# Patient Record
Sex: Female | Born: 1995 | Race: Black or African American | Hispanic: No | Marital: Single | Smoking: Never smoker
Health system: Southern US, Community
[De-identification: ages and names within clinical notes are randomized; demographics above are authoritative.]

---
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
## 2017-06-13 ENCOUNTER — Emergency Department (HOSPITAL_COMMUNITY)
Admission: EM | Admit: 2017-06-13 | Discharge: 2017-06-14 | Disposition: A | Discharge status: Home / Self Care | Payer: BLUE CROSS/BLUE SHIELD | Attending: Emergency Medicine | Admitting: Emergency Medicine

## 2017-06-13 DIAGNOSIS — F10920 Alcohol use, unspecified with intoxication, uncomplicated: Secondary | ICD-10-CM | POA: Insufficient documentation

## 2017-06-13 LAB — CBG MONITORING, ED: GLUCOSE-CAPILLARY: 69 mg/dL (ref 65–99)

## 2017-06-13 MED ORDER — SODIUM CHLORIDE 0.9 % IV BOLUS
1000.0000 mL | Freq: Once | INTRAVENOUS | Status: AC
  Administered 2017-06-13: 1000 mL via INTRAVENOUS

## 2017-06-13 NOTE — ED Notes (Signed)
Bed: [id number] Expected date:  Expected time:  Means of arrival:  Comments: 21 ETOH/vomiting

## 2017-06-13 NOTE — ED Triage Notes (Signed)
Pt BIB [facility]. Pt has been drinking since 1500. The pt is unsure of the amount of ETOH consumed. She reports that it was dark liquor. She is CAOx4, easily able to be aroused. Pt vomited prior to arrival and 4mg  of zofran was given via EMS. Pt able to control airway.

## 2017-06-14 LAB — POC URINE PREG, ED: Preg Test, Ur: NEGATIVE

## 2017-06-14 NOTE — ED Provider Notes (Signed)
[facility]-EMERGENCY DEPT Provider Note   CSN: [id number] Arrival date & time: 06/13/17  2029     History   Chief Complaint Chief Complaint  Patient presents with  . Alcohol Intoxication    HPI Windell Norfolkayla Grimes is a 22 y.o. female.  HPI  22yo female presents to the ED with her friend with concern for intoxication.  Reports they had been drinking dark liquor since 3PM, not sure exactly how much.  Had nausea and vomiting.  Denies other drug use, pain or other concerns on initial eval.  On reeval reports her brother has a history of CHF and she has had episodes of syncope preceded by sensation of heat and lightheadedness in the past.  No episode today specifically but reports she was wondering about this. Had been evaluated at home for it before.   No past medical history on file.  There are no active problems to display for this patient.   No past surgical history on file.   OB History   None      Home Medications    Prior to Admission medications   Not on File    Family History No family history on file.  Social History Social History   Tobacco Use  . Smoking status: Never Smoker  . Smokeless tobacco: Never Used  Substance Use Topics  . Alcohol use: No  . Drug use: No     Allergies   Patient has no known allergies.   Review of Systems Review of Systems  Unable to perform ROS: Mental status change  Constitutional: Negative for fever.  Respiratory: Negative for cough.   Gastrointestinal: Positive for nausea and vomiting.  Neurological: Negative for syncope (has had in past).     Physical Exam Updated Vital Signs BP 104/64   Pulse (!) 106   Temp 97.7 F (36.5 C) (Oral)   Resp 20   SpO2 99%   Physical Exam  Constitutional: She appears well-developed and well-nourished. No distress.  HENT:  Head: Normocephalic and atraumatic.  Eyes: Conjunctivae and EOM are normal.  Neck: Normal range of motion.  Cardiovascular:  Normal rate, regular rhythm, normal heart sounds and intact distal pulses. Exam reveals no gallop and no friction rub.  No murmur heard. Pulmonary/Chest: Effort normal and breath sounds normal. No respiratory distress. She has no wheezes. She has no rales.  Abdominal: Soft. She exhibits no distension. There is no tenderness. There is no guarding.  Musculoskeletal: She exhibits no edema or tenderness.  Neurological: She is alert.  Intoxicated, oriented  Skin: Skin is warm and dry. No rash noted. She is not diaphoretic. No erythema.  Nursing note and vitals reviewed.    ED Treatments / Results  Labs (all labs ordered are listed, but only abnormal results are displayed) Labs Reviewed  CBG MONITORING, ED  POC URINE PREG, ED    EKG EKG Interpretation  Date/Time:  Wednesday June 14 2017 00:22:22 EDT Ventricular Rate:  81 PR Interval:    QRS Duration: 88 QT Interval:  381 QTC Calculation: 443 R Axis:   87 Text Interpretation:  Sinus rhythm No previous ECGs available Confirmed by Alvira MondaySchlossman, Desani Sprung ([id number]) on 06/14/2017 12:32:07 AM Also confirmed by Alvira MondaySchlossman, Khole Arterburn ([id number]), editor Elita QuickWatlington, Beverly ([id number])  on 06/14/2017 7:24:39 AM   Radiology No results found.  Procedures Procedures (including critical care time)  Medications Ordered in ED Medications  sodium chloride 0.9 % bolus 1,000 mL (0 mLs Intravenous Stopped 06/13/17 2117)     Initial  Impression / Assessment and Plan / ED Course  I have reviewed the triage vital signs and the nursing notes.  Pertinent labs & imaging results that were available during my care of the patient were reviewed by me and considered in my medical decision making (see chart for details).     21yo female who was celebrating [employer] with her friend and drinking since 3PM presents with her friend for etoh intoxication. Denies other ingestions. Protecting airway.  Given IV fluids and observed until clinical sobriety. Discussed dangers of etoh.  Pt reports has had prior syncopal episodes (not today), screening ecg done without red flags. Pregnancy test negative. Recommend follow up with PCP regarding this.  Patient discharged in stable condition with understanding of reasons to return.   Final Clinical Impressions(s) / ED Diagnoses   Final diagnoses:  Alcoholic intoxication without complication [facility])    ED Discharge Orders    None       Alvira Monday, MD 06/14/17 1330

## 2018-05-16 ENCOUNTER — Encounter (HOSPITAL_COMMUNITY): Payer: Self-pay | Admitting: Emergency Medicine

## 2018-05-16 ENCOUNTER — Emergency Department (HOSPITAL_COMMUNITY)
Admission: EM | Admit: 2018-05-16 | Discharge: 2018-05-16 | Disposition: A | Discharge status: Home / Self Care | Payer: BLUE CROSS/BLUE SHIELD | Attending: Emergency Medicine | Admitting: Emergency Medicine

## 2018-05-16 ENCOUNTER — Emergency Department (HOSPITAL_COMMUNITY): Payer: BLUE CROSS/BLUE SHIELD

## 2018-05-16 DIAGNOSIS — N939 Abnormal uterine and vaginal bleeding, unspecified: Secondary | ICD-10-CM | POA: Insufficient documentation

## 2018-05-16 DIAGNOSIS — R1031 Right lower quadrant pain: Secondary | ICD-10-CM | POA: Diagnosis not present

## 2018-05-16 DIAGNOSIS — R102 Pelvic and perineal pain: Secondary | ICD-10-CM | POA: Diagnosis present

## 2018-05-16 LAB — URINALYSIS, ROUTINE W REFLEX MICROSCOPIC
Bilirubin Urine: NEGATIVE
Glucose, UA: NEGATIVE mg/dL
KETONES UR: NEGATIVE mg/dL
LEUKOCYTE UA: NEGATIVE
Nitrite: NEGATIVE
Protein, ur: 30 mg/dL — AB
SPECIFIC GRAVITY, URINE: 1.029 (ref 1.005–1.030)
pH: 5 (ref 5.0–8.0)

## 2018-05-16 LAB — WET PREP, GENITAL
CLUE CELLS WET PREP: NONE SEEN
Sperm: NONE SEEN
TRICH WET PREP: NONE SEEN
YEAST WET PREP: NONE SEEN

## 2018-05-16 LAB — POC URINE PREG, ED: PREG TEST UR: NEGATIVE

## 2018-05-16 MED ORDER — MELOXICAM 15 MG PO TABS: 15.0000 mg | ORAL_TABLET | Freq: Every day | ORAL | 0 refills | Status: AC

## 2018-05-16 NOTE — ED Provider Notes (Signed)
[facility] EMERGENCY DEPARTMENT Provider Note   CSN: [id number] Arrival date & time: 05/16/18  1505    History   Chief Complaint Chief Complaint  Patient presents with  . Vaginal Bleeding    HPI Vanessa Lucas is a 23 y.o. female  Who presents from the [facility] for evaluation of pelvic pain and bleeding. The patient states that Her period ended last week. She developed pelvic pain that has been progressively worsening over the past 4 days. She c/o pain that is worse when she laughs, coughs, or ambulates, and greatly improved when she lies still. She is sexually active with a single female partner. She does not always use condoms and she is not on birth control. She denies n/v or urinary sxs. She denies constipation or diarrhea. She had a pelvic examination PTA and reports that the provider felt fullness and tenderness in her R adnexa and sent her for [place]. She reports that she had a wet prep but does not have results.      HPI  History reviewed. No pertinent past medical history.  There are no active problems to display for this patient.   History reviewed. No pertinent surgical history.   OB History   No obstetric history on file.      Home Medications    Prior to Admission medications   Not on File    Family History No family history on file.  Social History Social History   Tobacco Use  . Smoking status: Never Smoker  . Smokeless tobacco: Never Used  Substance Use Topics  . Alcohol use: Yes    Comment: social  . Drug use: Yes    Types: Marijuana     Allergies   Patient has no known allergies.   Review of Systems Review of Systems Ten systems reviewed and are negative for acute change, except as noted in the HPI.    Physical Exam Updated Vital Signs BP 118/85 (BP Location: Right Arm)   Pulse 74   Temp 98.3 F (36.8 C) (Oral)   Resp 15   LMP 05/09/2018 (Exact Date)   SpO2 99%   Physical Exam Vitals  signs and nursing note reviewed.  Constitutional:      General: She is not in acute distress.    Appearance: She is well-developed. She is not diaphoretic.  HENT:     Head: Normocephalic and atraumatic.  Eyes:     General: No scleral icterus.    Conjunctiva/sclera: Conjunctivae normal.  Neck:     Musculoskeletal: Normal range of motion.  Cardiovascular:     Rate and Rhythm: Normal rate and regular rhythm.     Heart sounds: Normal heart sounds. No murmur. No friction rub. No gallop.   Pulmonary:     Effort: Pulmonary effort is normal. No respiratory distress.     Breath sounds: Normal breath sounds.  Abdominal:     General: Abdomen is flat. Bowel sounds are normal. There is no distension.     Palpations: Abdomen is soft. There is no mass.     Tenderness: There is no abdominal tenderness. There is no right CVA tenderness, left CVA tenderness or guarding.  Skin:    General: Skin is warm and dry.  Neurological:     Mental Status: She is alert and oriented to person, place, and time.  Psychiatric:        Behavior: Behavior normal.      ED Treatments / Results  Labs (all labs ordered are listed, but only abnormal results are displayed) Labs Reviewed  URINALYSIS, ROUTINE W REFLEX MICROSCOPIC  POC URINE PREG, ED  GC/CHLAMYDIA PROBE AMP ([place]) NOT AT [facility]    EKG None  Radiology No results found.  Procedures Procedures (including critical care time)  Medications Ordered in ED Medications - No data to display   Initial Impression / Assessment and Plan / ED Course  I have reviewed the triage vital signs and the nursing notes.  Pertinent labs & imaging results that were available during my care of the patient were reviewed by me and considered in my medical decision making (see chart for details).  Clinical Course as of May 15 1700  Wed May 16, 2018  1630 Appears contaminated  Urinalysis, Routine w reflex microscopic(!) [AH]    Clinical Course User Index [AH]  Arthor Captain, PA-C       CC:RLQ/Pelvic pain VS: BP 118/85 (BP Location: Right Arm)   Pulse 74   Temp 98.3 F (36.8 C) (Oral)   Resp 15   LMP 05/09/2018 (Exact Date)   SpO2 99%  QM:GQQPYPP is gathered by the patient and nursing triage. DDX: Differential diagnosis of her lower abdominal considerations include pelvic inflammatory disease, ectopic pregnancy, appendicitis, urinary calculi, primary dysmenorrhea, septic abortion, ruptured ovarian cyst or tumor, ovarian torsion, tubo-ovarian abscess, degeneration of fibroid, endometriosis, diverticulitis, cystitis. Labs: I reviewed the labs which show contaminated UA, but prep does not show any significant abnormalities.  Negative pregnancy Imaging: I personally reviewed the images (US pelvis/doppler) which show(s) No acute abnormalities including TOA/Ectopic/ or Torsion JKD:TOIZ  MDM: Patient here with right lower quadrant abdominal pain.  Had a pelvic exam 1 hour prior to arrival and I did not repeat the pelvic exam.  On reevaluation the patient does have right lower quadrant tenderness however she has a very thin body habitus.  I discussed further work-up to rule out appendicitis with labs and CT imaging.  The patient however declines further examination at this time.  She was not told there was concern by previous provider for PID.  I do not feel that we need to treat the patient prophylactically for PID or cervicitis.  Her ultrasound does not show evidence of tubo-ovarian abscess or ovarian torsion.  I discussed reasons to return for CT scan of the abdomen to include nausea, vomiting, worsening pain, fevers, chills, myalgias.  The patient appears reasonable and competent to make return should she have any worsening symptoms.  Patient disposition: home Patient condition: The patient appears reasonably screened and/or stabilized for discharge and I doubt any other medical condition or other [facility] requiring further screening, evaluation, or  treatment in the ED at this time prior to discharge.       Final Clinical Impressions(s) / ED Diagnoses   Final diagnoses:  RLQ abdominal pain    ED Discharge Orders    None       Arthor Captain, PA-C 05/16/18 1709    Derwood Kaplan, MD 05/16/18 1955

## 2018-05-16 NOTE — ED Triage Notes (Signed)
Patient reports onset of lower abdominal cramping 2 days ago and vaginal bleeding (spotting) since yesterday morning. States LMP last week and went to health center at [employer] - had pelvic done and sent here for U/S.

## 2018-05-16 NOTE — Discharge Instructions (Signed)

## 2018-05-16 NOTE — ED Notes (Signed)
Patient transported to US 

## 2018-05-16 NOTE — ED Notes (Signed)
Patient verbalized understanding of discharge instructions and denies any further needs or questions at this time. VS stable. Patient ambulatory with steady gait.  

## 2018-05-17 LAB — GC/CHLAMYDIA PROBE AMP (~~LOC~~) NOT AT ARMC
CHLAMYDIA, DNA PROBE: NEGATIVE
Neisseria Gonorrhea: NEGATIVE

## 2019-06-13 ENCOUNTER — Ambulatory Visit: Payer: BC Managed Care – PPO | Attending: Family

## 2019-06-13 DIAGNOSIS — Z23 Encounter for immunization: Secondary | ICD-10-CM

## 2019-06-13 NOTE — Progress Notes (Signed)
   Covid-19 Vaccination Clinic  Name:  Vanessa Lucas    MRN: [id number] DOB: Dec 19, 1995  06/13/2019  Vanessa Lucas was observed post Covid-19 immunization for 15 minutes without incident. She was provided with Vaccine Information Sheet and instruction to access the V-Safe system.   Vanessa Lucas was instructed to call 911 with any severe reactions post vaccine: Marland Kitchen Difficulty breathing  . Swelling of face and throat  . A fast heartbeat  . A bad rash all over body  . Dizziness and weakness   Immunizations Administered    Name Date Dose VIS Date Route   Moderna COVID-19 Vaccine 06/13/2019 12:52 PM 0.5 mL 01/29/2019 Intramuscular   Manufacturer: Moderna   Lot: [id number]   NDC: 14643-142-76

## 2019-07-16 ENCOUNTER — Ambulatory Visit: Payer: BC Managed Care – PPO | Attending: Family

## 2019-07-16 DIAGNOSIS — Z23 Encounter for immunization: Secondary | ICD-10-CM

## 2019-07-16 NOTE — Progress Notes (Signed)
   Covid-19 Vaccination Clinic  Name:  Vanessa Lucas    MRN: [id number] DOB: 10-04-1995  07/16/2019  Vanessa Lucas was observed post Covid-19 immunization for 15 minutes without incident. She was provided with Vaccine Information Sheet and instruction to access the V-Safe system.   Vanessa Lucas was instructed to call 911 with any severe reactions post vaccine: Marland Kitchen Difficulty breathing  . Swelling of face and throat  . A fast heartbeat  . A bad rash all over body  . Dizziness and weakness   Immunizations Administered    Name Date Dose VIS Date Route   Moderna COVID-19 Vaccine 07/16/2019  2:17 PM 0.5 mL 01/2019 Intramuscular   Manufacturer: Moderna   Lot: [id number]   NDC: 65800-634-94

## 2020-03-26 IMAGING — US TRANSVAGINAL ULTRASOUND OF PELVIS
1 series · 13 of 25 positions shown · non-contrast
Comparison: None.

CLINICAL DATA: Pelvic pain for 3 days.



[Series 1: transvaginal ultrasound of pelvis · 13 of 78 slices shown]
[im 1/78]
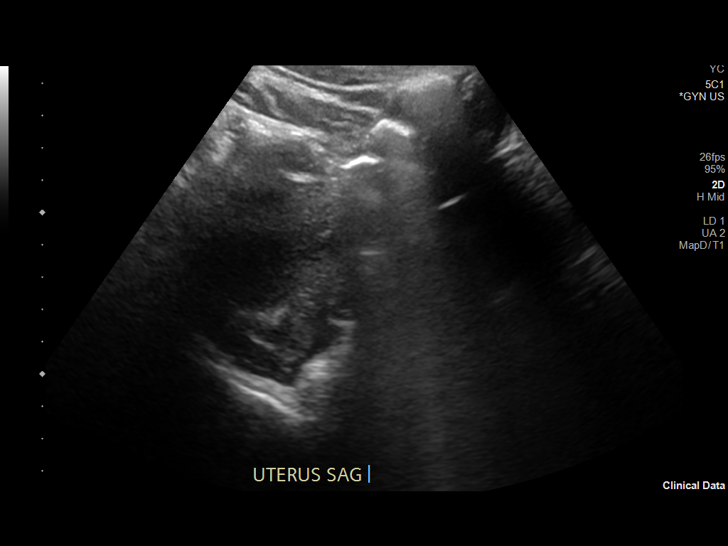
[im 7/78]
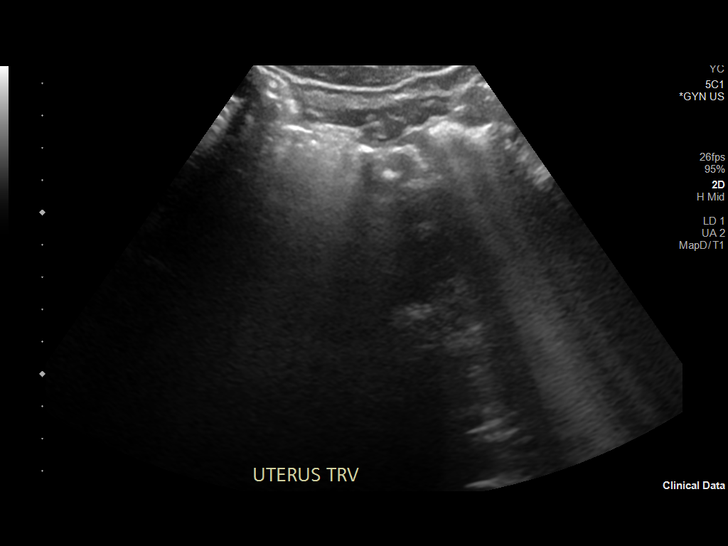
[im 13/78]
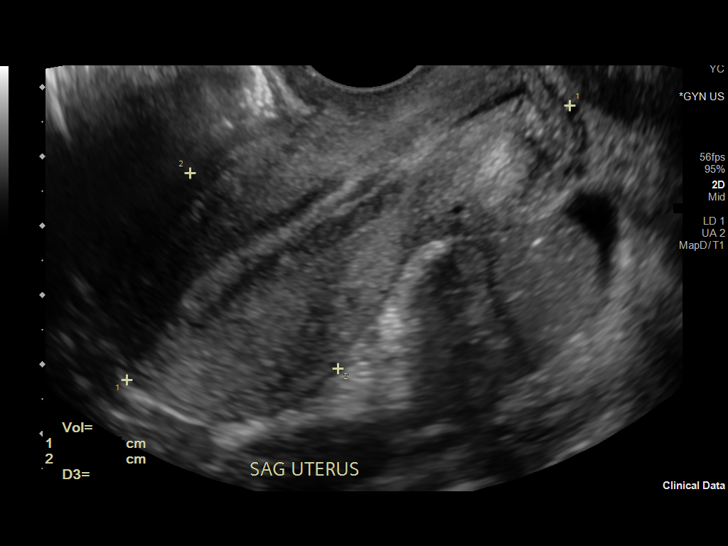
[im 20/78]
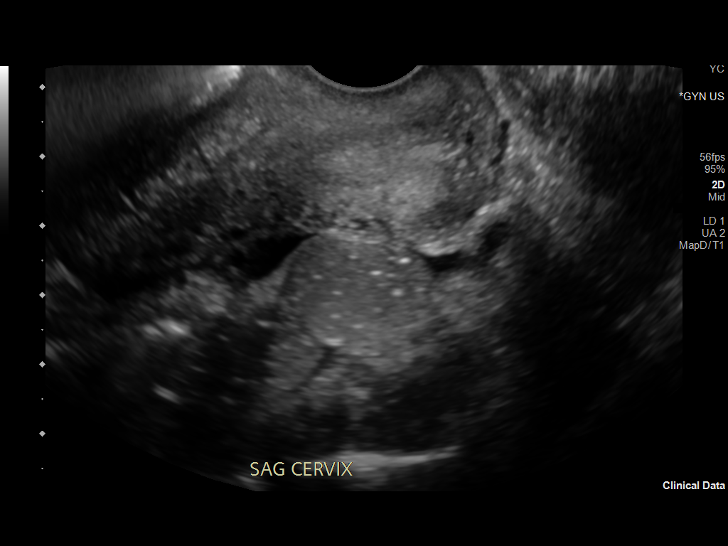
[im 26/78]
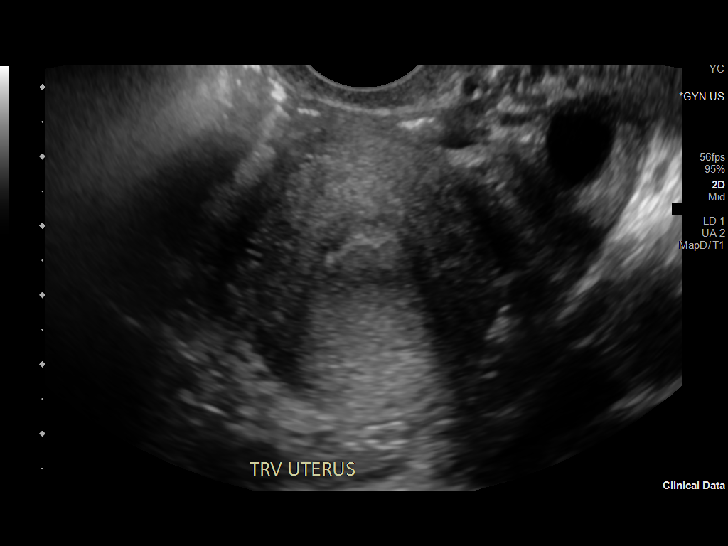
[im 33/78]
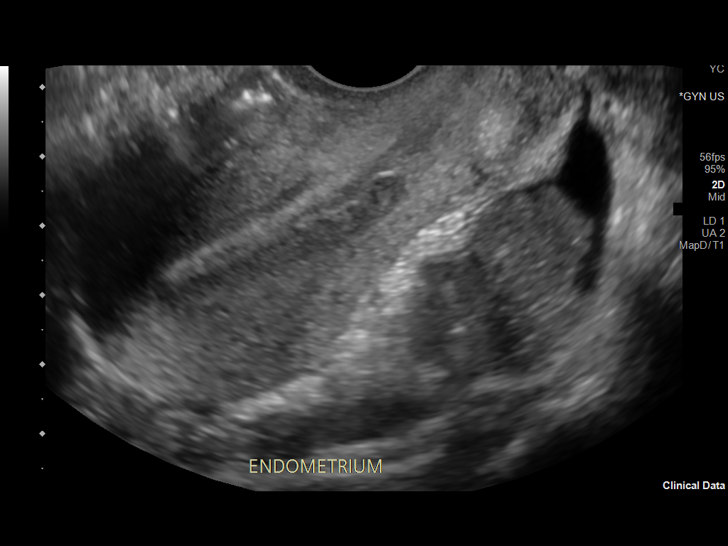
[im 39/78]
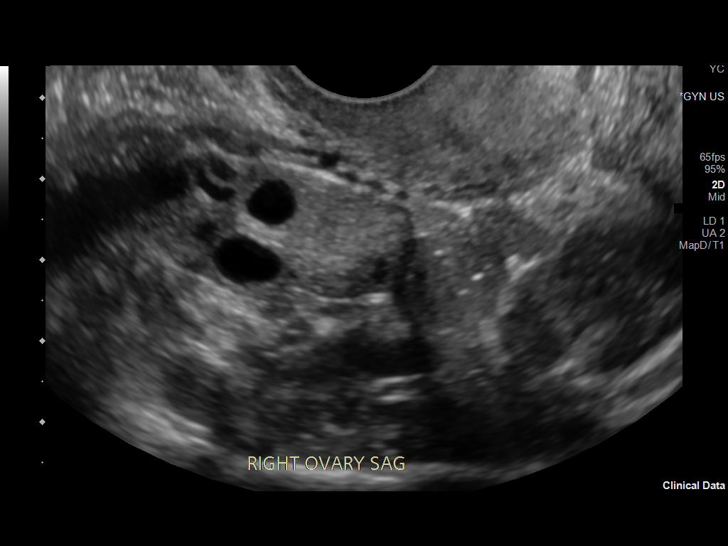
[im 45/78]
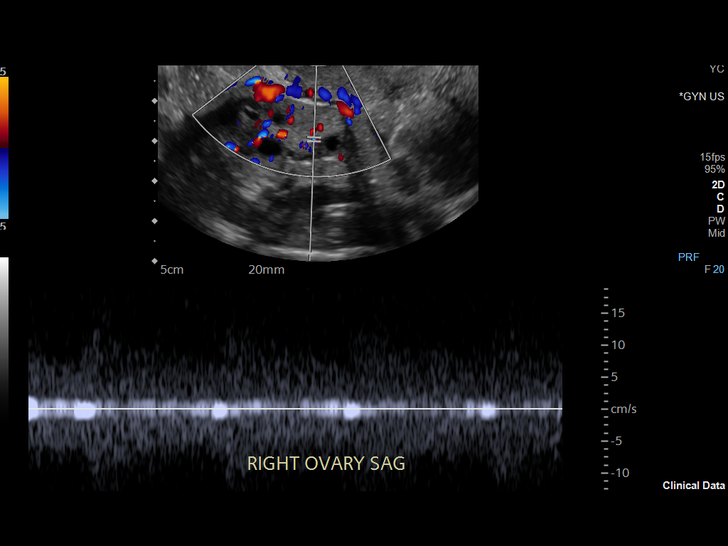
[im 52/78]
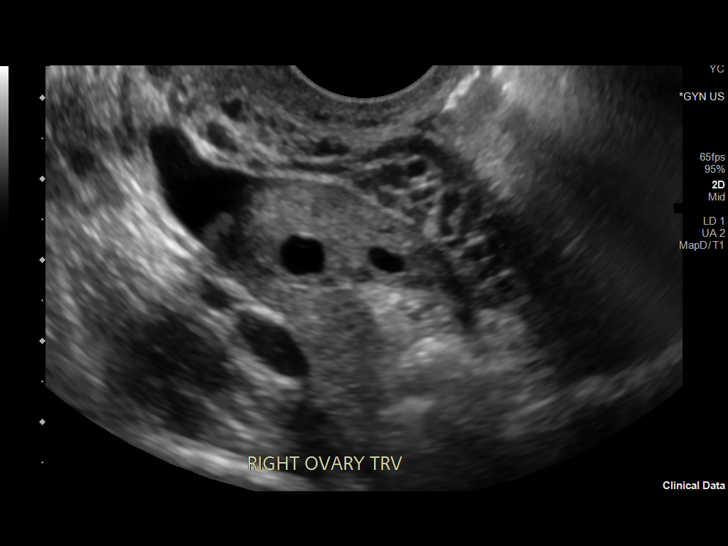
[im 58/78]
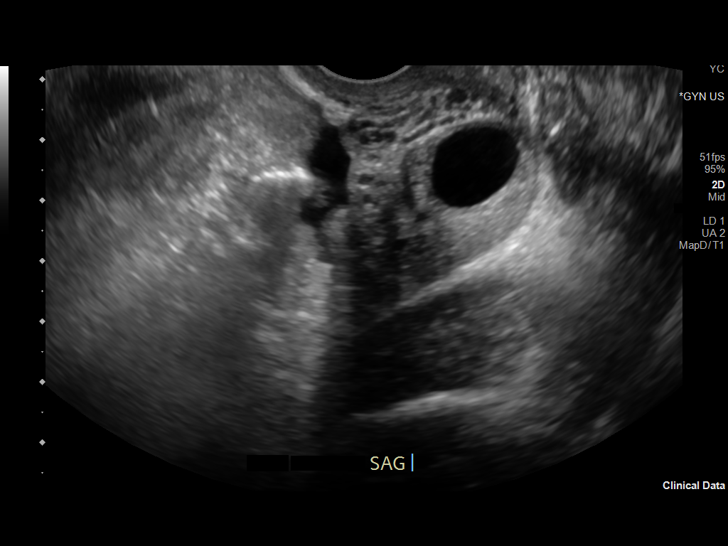
[im 65/78]
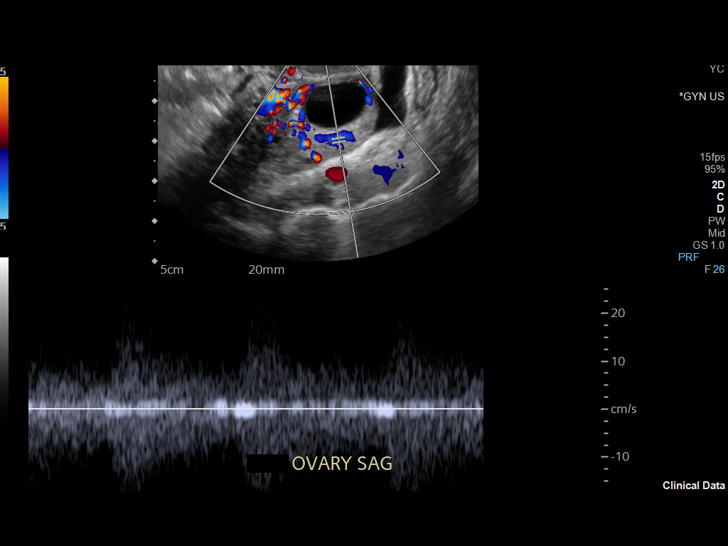
[im 71/78]
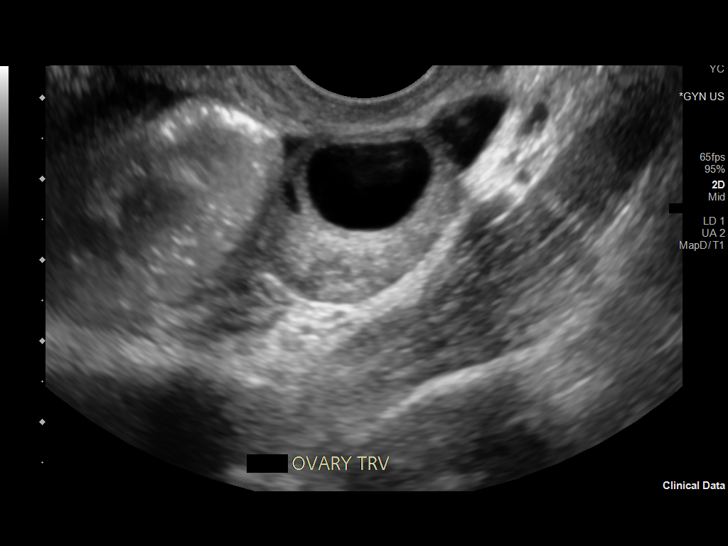
[im 78/78]
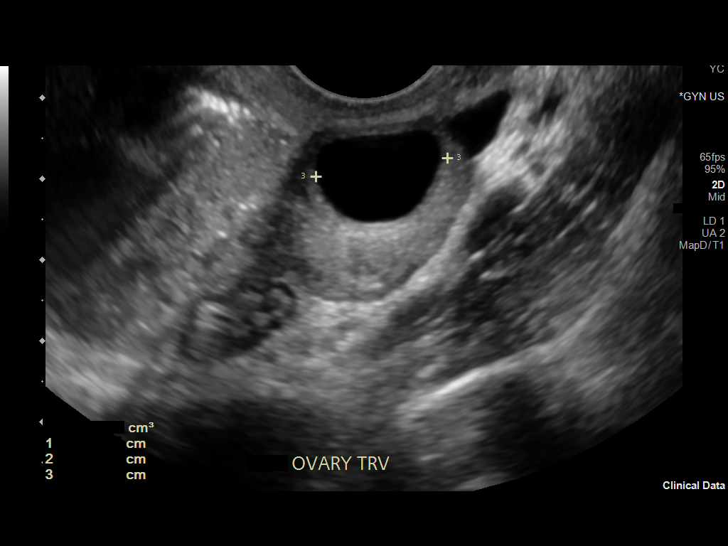

[13 of 25 positions shown; findings below may reference images not displayed]

FINDINGS: Uterus

Measurements: 7.5 x 3.5 x 4.1 cm = volume: 57.3 mL. No fibroids or
other mass visualized.

Endometrium

Thickness: 6.2 mm. Small amount of fluid in the endometrial canal in
the lower uterine segment and also in the cervical canal.

Right ovary

Measurements: 3.6 x 1.7 x 2.8 cm = volume: 8.9 mL. Normal
appearance/no adnexal mass.

Left ovary

Measurements: 3.5 x 2.0 x 2.6 cm = volume: 9.5 mL. There is a simple
appearing 1.9 x 1.2 x 1.6 cm cyst

Pulsed Doppler evaluation of both ovaries demonstrates normal
low-resistance arterial and venous waveforms.

Other findings

Small amount of free pelvic fluid.
IMPRESSION: Normal pelvic ultrasound examination.
